# Patient Record
Sex: Male | Born: 1995 | Race: White | Hispanic: No | Marital: Single | Smoking: Former smoker
Health system: Southern US, Community
[De-identification: ages and names within clinical notes are randomized; demographics above are authoritative.]

---
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
## 2014-02-04 ENCOUNTER — Ambulatory Visit: Payer: Self-pay | Admitting: Internal Medicine

## 2017-05-04 ENCOUNTER — Ambulatory Visit
Admission: EM | Admit: 2017-05-04 | Discharge: 2017-05-04 | Disposition: A | Discharge status: Home / Self Care | Payer: BLUE CROSS/BLUE SHIELD | Attending: Family Medicine | Admitting: Family Medicine

## 2017-05-04 DIAGNOSIS — R197 Diarrhea, unspecified: Secondary | ICD-10-CM | POA: Diagnosis not present

## 2017-05-04 DIAGNOSIS — R1013 Epigastric pain: Secondary | ICD-10-CM | POA: Diagnosis not present

## 2017-05-04 LAB — COMPREHENSIVE METABOLIC PANEL
ALBUMIN: 5 g/dL (ref 3.5–5.0)
ALT: 20 U/L (ref 17–63)
AST: 22 U/L (ref 15–41)
Alkaline Phosphatase: 47 U/L (ref 38–126)
Anion gap: 7 (ref 5–15)
BUN: 10 mg/dL (ref 6–20)
CO2: 27 mmol/L (ref 22–32)
Calcium: 9.7 mg/dL (ref 8.9–10.3)
Chloride: 101 mmol/L (ref 101–111)
Creatinine, Ser: 1.08 mg/dL (ref 0.61–1.24)
GFR calc Af Amer: >60 mL/min (ref >60)
GFR calc non Af Amer: >60 mL/min (ref >60)
GLUCOSE: 98 mg/dL (ref 65–99)
POTASSIUM: 3.8 mmol/L (ref 3.5–5.1)
SODIUM: 135 mmol/L (ref 135–145)
Total Bilirubin: 1 mg/dL (ref 0.3–1.2)
Total Protein: 8.1 g/dL (ref 6.5–8.1)

## 2017-05-04 LAB — CBC WITH DIFFERENTIAL/PLATELET
BASOS PCT: 0 %
Basophils Absolute: 0 10*3/uL (ref 0–0.1)
Eosinophils Absolute: 0 10*3/uL (ref 0–0.7)
Eosinophils Relative: 0 %
HEMATOCRIT: 41 % (ref 40.0–52.0)
HEMOGLOBIN: 14 g/dL (ref 13.0–18.0)
LYMPHS ABS: 1.8 10*3/uL (ref 1.0–3.6)
Lymphocytes Relative: 14 %
MCH: 29.6 pg (ref 26.0–34.0)
MCHC: 34.1 g/dL (ref 32.0–36.0)
MCV: 86.7 fL (ref 80.0–100.0)
MONOS PCT: 11 %
Monocytes Absolute: 1.4 10*3/uL — ABNORMAL HIGH (ref 0.2–1.0)
NEUTROS ABS: 9.2 10*3/uL — AB (ref 1.4–6.5)
NEUTROS PCT: 75 %
Platelets: 158 10*3/uL (ref 150–440)
RBC: 4.72 MIL/uL (ref 4.40–5.90)
RDW: 14.6 % — ABNORMAL HIGH (ref 11.5–14.5)
WBC: 12.4 10*3/uL — ABNORMAL HIGH (ref 3.8–10.6)

## 2017-05-04 LAB — LIPASE, BLOOD: Lipase: 24 U/L (ref 11–51)

## 2017-05-04 MED ORDER — CIPROFLOXACIN HCL 500 MG PO TABS: 500.0000 mg | ORAL_TABLET | Freq: Two times a day (BID) | ORAL | 0 refills | Status: AC

## 2017-05-04 MED ORDER — GI COCKTAIL ~~LOC~~
30.0000 mL | Freq: Once | ORAL | Status: AC
  Administered 2017-05-04: 30 mL via ORAL

## 2017-05-04 NOTE — ED Provider Notes (Signed)
[facility]    CSN: [id number] Arrival date & time: 05/04/17  1700     History   Chief Complaint Chief Complaint  Patient presents with  . Abdominal Pain    HPI Jeffrey Camacho is a 21 y.o. male.   21 yo male with a c/o 2-3 weeks of mucousy diarrhea and 3 days of epigastric pain. Denies any fevers, chills, melena, hematochezia.    The history is provided by the patient.    No past medical history on file.  There are no active problems to display for this patient.   No past surgical history on file.     Home Medications    Prior to Admission medications   Medication Sig Start Date End Date Taking? Authorizing Provider  famotidine (PEPCID) 20 MG tablet Take 20 mg by mouth 2 (two) times daily.   Yes [provider]  omeprazole (PRILOSEC) 10 MG capsule Take 10 mg by mouth daily.   Yes [provider]  ciprofloxacin (CIPRO) 500 MG tablet Take 1 tablet (500 mg total) by mouth every 12 (twelve) hours. 05/04/17   Payton Mccallumonty, Ann-Marie Kluge, MD    Family History No family history on file.  Social History Social History  Substance Use Topics  . Smoking status: Former Smoker    Years: 3.00    Types: Cigarettes    Quit date: 11/01/2016  . Smokeless tobacco: Never Used     Comment: social  . Alcohol use Yes     Allergies   Patient has no known allergies.   Review of Systems Review of Systems   Physical Exam Triage Vital Signs ED Triage Vitals  Enc Vitals Group     BP 05/04/17 1733 (!) 143/79     Pulse Rate 05/04/17 1733 61     Resp 05/04/17 1733 16     Temp 05/04/17 1733 99 F (37.2 C)     Temp Source 05/04/17 1733 Oral     SpO2 05/04/17 1733 99 %     Weight 05/04/17 1734 180 lb (81.6 kg)     Height 05/04/17 1734 6\' 4"  (1.93 m)     Head Circumference --      Peak Flow --      Pain Score 05/04/17 1734 4     Pain Loc --      Pain Edu? --      Excl. in GC? --    No data found.   Updated Vital Signs BP (!) 143/79 (BP Location:  Left Arm)   Pulse 61   Temp 99 F (37.2 C) (Oral)   Resp 16   Ht 6\' 4"  (1.93 m)   Wt 180 lb (81.6 kg)   SpO2 99%   BMI 21.91 kg/m   Visual Acuity Right Eye Distance:   Left Eye Distance:   Bilateral Distance:    Right Eye Near:   Left Eye Near:    Bilateral Near:     Physical Exam  Constitutional: He is oriented to person, place, and time. He appears well-developed and well-nourished. No distress.  HENT:  Head: Normocephalic and atraumatic.  Cardiovascular: Normal rate, regular rhythm, normal heart sounds and intact distal pulses.   No murmur heard. Pulmonary/Chest: Effort normal and breath sounds normal. No respiratory distress. He has no wheezes. He has no rales.  Abdominal: Soft. Bowel sounds are normal. He exhibits no distension and no mass. There is tenderness (mild, diffuse; no rebound or guarding). There is no rebound and no guarding.  Neurological: He is alert and oriented to person, place, and time.  Skin: No rash noted. He is not diaphoretic.  Nursing note and vitals reviewed.    UC Treatments / Results  Labs (all labs ordered are listed, but only abnormal results are displayed) Labs Reviewed  CBC WITH DIFFERENTIAL/PLATELET - Abnormal; Notable for the following:       Result Value   WBC 12.4 (*)    RDW 14.6 (*)    Neutro Abs 9.2 (*)    Monocytes Absolute 1.4 (*)    All other components within normal limits  COMPREHENSIVE METABOLIC PANEL  LIPASE, BLOOD    EKG  EKG Interpretation None       Radiology No results found.  Procedures Procedures (including critical care time)  Medications Ordered in UC Medications  gi cocktail (Maalox,Lidocaine,Donnatal) (30 mLs Oral Given 05/04/17 1756)     Initial Impression / Assessment and Plan / UC Course  I have reviewed the triage vital signs and the nursing notes.  Pertinent labs & imaging results that were available during my care of the patient were reviewed by me and considered in my medical decision  making (see chart for details).       Final Clinical Impressions(s) / UC Diagnoses   Final diagnoses:  Diarrhea of presumed infectious origin    New Prescriptions Discharge Medication List as of 05/04/2017  6:28 PM    START taking these medications   Details  ciprofloxacin (CIPRO) 500 MG tablet Take 1 tablet (500 mg total) by mouth every 12 (twelve) hours., Starting Fri 05/04/2017, Normal       1. Lab results and diagnosis reviewed with patient 2. rx as per orders above; reviewed possible side effects, interactions, risks and benefits; empiric tx 3. Recommend supportive treatment with rest, fluids, otc antacids  4. Follow-up prn if symptoms worsen or don't improve  Controlled Substance Prescriptions [place] Controlled Substance Registry consulted? Not Applicable   Payton Mccallum, MD 05/04/17 2023

## 2017-05-04 NOTE — ED Triage Notes (Signed)
X 3 days. Is c/o mid-RUQ pain, sometimes LUQ. He is also c/o mucus in his stool. He has decreased appetite, vomiting (from acid reflux per pt). He denies nausea, fever. Is taking Pepcid and Prilosec, without relief.

## 2017-05-17 ENCOUNTER — Ambulatory Visit: Payer: Self-pay | Admitting: Internal Medicine

## 2019-02-20 DIAGNOSIS — R509 Fever, unspecified: Secondary | ICD-10-CM | POA: Diagnosis not present

## 2019-02-20 DIAGNOSIS — R05 Cough: Secondary | ICD-10-CM | POA: Diagnosis not present

## 2019-02-20 DIAGNOSIS — R51 Headache: Secondary | ICD-10-CM | POA: Diagnosis not present

## 2019-02-20 DIAGNOSIS — Z20828 Contact with and (suspected) exposure to other viral communicable diseases: Secondary | ICD-10-CM | POA: Diagnosis not present

## 2019-09-06 DIAGNOSIS — Z20828 Contact with and (suspected) exposure to other viral communicable diseases: Secondary | ICD-10-CM | POA: Diagnosis not present

## 2020-01-28 DIAGNOSIS — B349 Viral infection, unspecified: Secondary | ICD-10-CM | POA: Diagnosis not present

## 2020-01-28 DIAGNOSIS — Z20828 Contact with and (suspected) exposure to other viral communicable diseases: Secondary | ICD-10-CM | POA: Diagnosis not present

## 2020-02-01 DIAGNOSIS — Z20822 Contact with and (suspected) exposure to covid-19: Secondary | ICD-10-CM | POA: Diagnosis not present

## 2020-02-01 DIAGNOSIS — J029 Acute pharyngitis, unspecified: Secondary | ICD-10-CM | POA: Diagnosis not present

## 2020-05-02 DIAGNOSIS — J209 Acute bronchitis, unspecified: Secondary | ICD-10-CM | POA: Diagnosis not present

## 2020-05-02 DIAGNOSIS — Z20822 Contact with and (suspected) exposure to covid-19: Secondary | ICD-10-CM | POA: Diagnosis not present
